# Patient Record
Sex: Male | Born: 1990 | Race: White | Hispanic: No | Marital: Married
Health system: Southern US, Community
[De-identification: ages and names within clinical notes are randomized; demographics above are authoritative.]

## PROBLEM LIST (undated history)

## (undated) HISTORY — PX: LEG SURGERY: SHX1003

---

## 2012-09-26 ENCOUNTER — Ambulatory Visit (HOSPITAL_BASED_OUTPATIENT_CLINIC_OR_DEPARTMENT_OTHER)
Admission: RE | Admit: 2012-09-26 | Discharge: 2012-09-26 | Disposition: A | Discharge status: Home / Self Care | Payer: 59 | Source: Ambulatory Visit | Attending: Sports Medicine | Admitting: Sports Medicine

## 2012-09-26 ENCOUNTER — Ambulatory Visit (INDEPENDENT_AMBULATORY_CARE_PROVIDER_SITE_OTHER): Payer: 59 | Admitting: Sports Medicine

## 2012-09-26 VITALS — BP 137/81 | HR 74 | Ht 74.0 in | Wt 303.0 lb

## 2012-09-26 DIAGNOSIS — IMO0002 Reserved for concepts with insufficient information to code with codable children: Secondary | ICD-10-CM | POA: Insufficient documentation

## 2012-09-26 DIAGNOSIS — M545 Low back pain, unspecified: Secondary | ICD-10-CM | POA: Insufficient documentation

## 2012-09-26 DIAGNOSIS — Z299 Encounter for prophylactic measures, unspecified: Secondary | ICD-10-CM | POA: Insufficient documentation

## 2012-09-26 DIAGNOSIS — M5416 Radiculopathy, lumbar region: Secondary | ICD-10-CM

## 2012-09-26 MED ORDER — MELOXICAM 15 MG PO TABS: ORAL_TABLET | ORAL | Status: DC

## 2012-09-26 MED ORDER — PREDNISONE 50 MG PO TABS: ORAL_TABLET | ORAL | Status: DC

## 2012-09-26 NOTE — Assessment & Plan Note (Signed)
Checking routine bloodwork. 

## 2012-09-26 NOTE — Assessment & Plan Note (Signed)
This likely were present the left L5 radiculitis. X-rays, prednisone, Mobic, formal physical therapy. Return in 4 weeks, MRI for interventional injection planning if no better.

## 2012-09-26 NOTE — Progress Notes (Signed)
  Subjective:    CC: Establish care.   HPI:  Low back pain: Has been present for many years, localized in the left side of the low back, he thinks it started when he was hit in the back with a piece of metal at work. Since then he's had pain on and off, radiating down the left leg and L5 distribution, worse with sitting, worse with Valsalva. He went to another physician who told him nothing was wrong. No imaging was performed. Currently symptoms are moderate, persistent, denies any bowel or bladder incontinence, no constitutional symptoms.  Preventive measures: Like to establish care.  Past medical history, Surgical history, Family history not pertinant except as noted below, Social history, Allergies, and medications have been entered into the medical record, reviewed, and no changes needed.   Review of Systems: No headache, visual changes, nausea, vomiting, diarrhea, constipation, dizziness, abdominal pain, skin rash, fevers, chills, night sweats, swollen lymph nodes, weight loss, chest pain, body aches, joint swelling, muscle aches, shortness of breath, mood changes, visual or auditory hallucinations.  Objective:    General: Well Developed, well nourished, and in no acute distress.  Neuro: Alert and oriented x3, extra-ocular muscles intact, sensation grossly intact.  HEENT: Normocephalic, atraumatic, pupils equal round reactive to light, neck supple, no masses, no lymphadenopathy, thyroid nonpalpable.  Skin: Warm and dry, no rashes noted.  Cardiac: Regular rate and rhythm, no murmurs rubs or gallops.  Respiratory: Clear to auscultation bilaterally. Not using accessory muscles, speaking in full sentences.  Abdominal: Soft, nontender, nondistended, positive bowel sounds, no masses, no organomegaly.  Back Exam:  Inspection: Unremarkable  Motion: Flexion 45 deg, Extension 45 deg, Side Bending to 45 deg bilaterally,  Rotation to 45 deg bilaterally  SLR laying: Negative  XSLR laying: Negative   Palpable tenderness: None. FABER: negative. Sensory change: Gross sensation intact to all lumbar and sacral dermatomes.  Reflexes: 2+ at both patellar tendons, 2+ at achilles tendons, Babinski's downgoing.  Strength at foot  Plantar-flexion: 5/5 Dorsi-flexion: 5/5 Eversion: 5/5 Inversion: 5/5  Leg strength  Quad: 5/5 Hamstring: 5/5 Hip flexor: 5/5 Hip abductors: 5/5  Gait unremarkable.  Impression and Recommendations:    The patient was counselled, risk factors were discussed, anticipatory guidance given.

## 2012-10-07 LAB — COMPREHENSIVE METABOLIC PANEL WITH GFR
ALT: 41 U/L (ref 0–53)
AST: 24 U/L (ref 0–37)
Albumin: 3.9 g/dL (ref 3.5–5.2)
Alkaline Phosphatase: 73 U/L (ref 39–117)
Glucose, Bld: 100 mg/dL — ABNORMAL HIGH (ref 70–99)
Potassium: 4.3 meq/L (ref 3.5–5.3)
Sodium: 138 meq/L (ref 135–145)
Total Bilirubin: 0.7 mg/dL (ref 0.3–1.2)
Total Protein: 6.9 g/dL (ref 6.0–8.3)

## 2012-10-07 LAB — COMPREHENSIVE METABOLIC PANEL
BUN: 10 mg/dL (ref 6–23)
CO2: 23 mEq/L (ref 19–32)
Calcium: 9 mg/dL (ref 8.4–10.5)
Chloride: 104 mEq/L (ref 96–112)
Creat: 0.75 mg/dL (ref 0.50–1.35)

## 2012-10-07 LAB — HEMOGLOBIN A1C
Hgb A1c MFr Bld: 5 % (ref <5.7)
Mean Plasma Glucose: 97 mg/dL (ref <117)

## 2012-10-07 LAB — CBC
HCT: 41.7 % (ref 39.0–52.0)
Hemoglobin: 14.2 g/dL (ref 13.0–17.0)
MCH: 29.6 pg (ref 26.0–34.0)
MCHC: 34.1 g/dL (ref 30.0–36.0)
MCV: 87.1 fL (ref 78.0–100.0)
Platelets: 234 K/uL (ref 150–400)
RBC: 4.79 MIL/uL (ref 4.22–5.81)
RDW: 14.4 % (ref 11.5–15.5)
WBC: 6.1 10*3/uL (ref 4.0–10.5)

## 2012-10-07 LAB — LIPID PANEL
Cholesterol: 183 mg/dL (ref 0–200)
HDL: 43 mg/dL (ref >39)
LDL Cholesterol: 96 mg/dL (ref 0–99)
Total CHOL/HDL Ratio: 4.3 ratio
Triglycerides: 221 mg/dL — ABNORMAL HIGH (ref <150)
VLDL: 44 mg/dL — ABNORMAL HIGH (ref 0–40)

## 2012-10-07 LAB — TSH: TSH: 1.937 u[IU]/mL (ref 0.350–4.500)

## 2012-10-08 LAB — VITAMIN D 25 HYDROXY (VIT D DEFICIENCY, FRACTURES): Vit D, 25-Hydroxy: 31 ng/mL (ref 30–89)

## 2012-10-10 LAB — TESTOSTERONE, FREE, TOTAL, SHBG
Sex Hormone Binding: 11 nmol/L — ABNORMAL LOW (ref 13–71)
Testosterone, Free: 60.2 pg/mL (ref 47.0–244.0)
Testosterone-% Free: 3.1 % — ABNORMAL HIGH (ref 1.6–2.9)
Testosterone: 196 ng/dL — ABNORMAL LOW (ref 300–890)

## 2012-10-24 ENCOUNTER — Encounter: Payer: Self-pay | Admitting: Sports Medicine

## 2012-10-24 ENCOUNTER — Ambulatory Visit (INDEPENDENT_AMBULATORY_CARE_PROVIDER_SITE_OTHER): Payer: 59 | Admitting: Sports Medicine

## 2012-10-24 VITALS — BP 134/83 | HR 81 | Wt 307.0 lb

## 2012-10-24 DIAGNOSIS — E781 Pure hyperglyceridemia: Secondary | ICD-10-CM

## 2012-10-24 DIAGNOSIS — M5416 Radiculopathy, lumbar region: Secondary | ICD-10-CM

## 2012-10-24 DIAGNOSIS — IMO0002 Reserved for concepts with insufficient information to code with codable children: Secondary | ICD-10-CM

## 2012-10-24 MED ORDER — GABAPENTIN 300 MG PO CAPS: 300.0000 mg | ORAL_CAPSULE | Freq: Three times a day (TID) | ORAL | Status: DC

## 2012-10-24 NOTE — Progress Notes (Signed)
  Subjective:    CC: Follow up  HPI: Left lumbar radiculitis: Greatly improved with conservative measures, not doing any rehabilitation exercises yet the symptoms are predominantly at night. Symptoms are mild, continue to improve, radiate down the left leg in an L4 versus L5 distribution.  Hypertriglyceridemia:  Desires dietary strategies to improve this.  Past medical history, Surgical history, Family history not pertinant except as noted below, Social history, Allergies, and medications have been entered into the medical record, reviewed, and no changes needed.   Review of Systems: No fevers, chills, night sweats, weight loss, chest pain, or shortness of breath.   Objective:    General: Well Developed, well nourished, and in no acute distress.  Neuro: Alert and oriented x3, extra-ocular muscles intact, sensation grossly intact.  HEENT: Normocephalic, atraumatic, pupils equal round reactive to light, neck supple, no masses, no lymphadenopathy, thyroid nonpalpable.  Skin: Warm and dry, no rashes. Cardiac: Regular rate and rhythm, no murmurs rubs or gallops, no lower extremity edema.  Respiratory: Clear to auscultation bilaterally. Not using accessory muscles, speaking in full sentences.  X-rays reviewed personally, there is some anterior spurring at the L4-L5 level.  Impression and Recommendations:

## 2012-10-24 NOTE — Patient Instructions (Addendum)

## 2012-10-24 NOTE — Assessment & Plan Note (Signed)
Recommend dietary changes, he does think he may have eaten a high fat meal for fasting for his cholesterol test. We can recheck this in about 3 months.

## 2012-10-24 NOTE — Assessment & Plan Note (Signed)
Definitely improved. He does have some symptoms at night, I will add gabapentin at bedtime. He will do home exercises. I like to see him back in one month, MRI for interventional injection planning if no better. He does have some L4-L5 degenerative disc disease with anterior spurring seen on x-ray.

## 2012-11-22 ENCOUNTER — Encounter: Payer: Self-pay | Admitting: Sports Medicine

## 2012-11-22 ENCOUNTER — Ambulatory Visit (INDEPENDENT_AMBULATORY_CARE_PROVIDER_SITE_OTHER): Payer: 59 | Admitting: Sports Medicine

## 2012-11-22 VITALS — BP 136/85 | HR 104 | Wt 311.0 lb

## 2012-11-22 DIAGNOSIS — IMO0002 Reserved for concepts with insufficient information to code with codable children: Secondary | ICD-10-CM

## 2012-11-22 DIAGNOSIS — M5416 Radiculopathy, lumbar region: Secondary | ICD-10-CM

## 2012-11-22 MED ORDER — GABAPENTIN 300 MG PO CAPS: 300.0000 mg | ORAL_CAPSULE | Freq: Three times a day (TID) | ORAL | Status: DC

## 2012-11-22 MED ORDER — MELOXICAM 15 MG PO TABS: ORAL_TABLET | ORAL | Status: DC

## 2012-11-22 NOTE — Assessment & Plan Note (Signed)
Doing well with conservative measures including therapy, gabapentin, meloxicam. Switched to a Tempur-Pedic mattress which is evidence based to improve discogenic low back pain. Return to see me on an as-needed basis, if pain continues despite a gabapentin taper we will proceed with MRI for interventional injection planning.

## 2012-11-22 NOTE — Progress Notes (Signed)
  Subjective:    CC: Followup  HPI: Left lumbar radiculitis: Significantly improved with physical therapy, gabapentin, Mobic, she recently got a Tempur-Pedic mattress which is also helped his symptoms significantly. He is happy with the results so far. He only gets occasional pain, it really doesn't radiate down the leg anymore. It is mild, continues to improve. Worse with excessive physical activity and lifting.  Past medical history, Surgical history, Family history not pertinant except as noted below, Social history, Allergies, and medications have been entered into the medical record, reviewed, and no changes needed.   Review of Systems: No fevers, chills, night sweats, weight loss, chest pain, or shortness of breath.   Objective:    General: Well Developed, well nourished, and in no acute distress.  Neuro: Alert and oriented x3, extra-ocular muscles intact, sensation grossly intact.  HEENT: Normocephalic, atraumatic, pupils equal round reactive to light, neck supple, no masses, no lymphadenopathy, thyroid nonpalpable.  Skin: Warm and dry, no rashes. Cardiac: Regular rate and rhythm, no murmurs rubs or gallops, no lower extremity edema.  Respiratory: Clear to auscultation bilaterally. Not using accessory muscles, speaking in full sentences. Back Exam:  Inspection: Unremarkable  Motion: Flexion 45 deg, Extension 45 deg, Side Bending to 45 deg bilaterally,  Rotation to 45 deg bilaterally  SLR laying: Negative  XSLR laying: Negative  Palpable tenderness: None. FABER: negative. Sensory change: Gross sensation intact to all lumbar and sacral dermatomes.  Reflexes: 2+ at both patellar tendons, 2+ at achilles tendons, Babinski's downgoing.  Strength at foot  Plantar-flexion: 5/5 Dorsi-flexion: 5/5 Eversion: 5/5 Inversion: 5/5  Leg strength  Quad: 5/5 Hamstring: 5/5 Hip flexor: 5/5 Hip abductors: 5/5  Gait unremarkable.  Impression and Recommendations:

## 2012-12-03 ENCOUNTER — Emergency Department
Admission: EM | Admit: 2012-12-03 | Discharge: 2012-12-03 | Disposition: A | Discharge status: Home / Self Care | Payer: 59 | Source: Home / Self Care | Attending: Family Medicine | Admitting: Family Medicine

## 2012-12-03 DIAGNOSIS — L255 Unspecified contact dermatitis due to plants, except food: Secondary | ICD-10-CM

## 2012-12-03 MED ORDER — HYDROXYZINE HCL 50 MG PO TABS: 50.0000 mg | ORAL_TABLET | Freq: Four times a day (QID) | ORAL | Status: DC

## 2012-12-03 MED ORDER — TRIAMCINOLONE ACETONIDE 40 MG/ML IJ SUSP
40.0000 mg | Freq: Once | INTRAMUSCULAR | Status: AC
  Administered 2012-12-03: 40 mg via INTRAMUSCULAR

## 2012-12-03 NOTE — ED Notes (Addendum)
Tim Farrell has a rash on his arms, face and chest for 2 days. He believes this to be poison ivy. Denies fever, chills or sweats. He has tried Benadryl with little relief.

## 2012-12-03 NOTE — ED Provider Notes (Signed)
CSN: [id number]     Arrival date & time 12/03/12  0919 History   First MD Initiated Contact with Patient 12/03/12 [phone]     Chief Complaint  Patient presents with  . Rash    x 2 days      HPI Comments: Patient states he was exposed to poison ivy two days ago, and now has pruritic rash on arms, legs, abdomen, and forehead.  No systemic symptoms, feels well otherwise.  Itching not responding to Benadryl.  Patient is a 22 y.o. male presenting with rash. The history is provided by the patient and the spouse.  Rash Pain location:  Generalized Pain quality comment:  Itching Pain severity:  Mild Onset quality:  Gradual Duration:  2 days Timing:  Constant Progression:  Worsening Chronicity:  New Context comment:  Poison ivy contact Relieved by:  Nothing Worsened by:  Nothing tried Ineffective treatments: Benadryl. Associated symptoms: no chills, no cough, no fatigue, no fever, no sore throat and no vomiting     History reviewed. No pertinent past medical history. History reviewed. No pertinent past surgical history. History reviewed. No pertinent family history. History  Substance Use Topics  . Smoking status: Unknown If Ever Smoked  . Smokeless tobacco: Not on file  . Alcohol Use: Yes    Review of Systems  Constitutional: Negative for fever, chills and fatigue.  HENT: Negative for sore throat.   Respiratory: Negative for cough.   Gastrointestinal: Negative for vomiting.  Skin: Positive for rash.  All other systems reviewed and are negative.    Allergies  Review of patient's allergies indicates no known allergies.  Home Medications   Current Outpatient Rx  Name  Route  Sig  Dispense  Refill  . gabapentin (NEURONTIN) 300 MG capsule   Oral   Take 1 capsule (300 mg total) by mouth 3 (three) times daily.   90 capsule   3   . meloxicam (MOBIC) 15 MG tablet      One tab PO qAM with breakfast for 2 weeks, then daily prn pain.   90 tablet   3   . hydrOXYzine  (ATARAX/VISTARIL) 50 MG tablet   Oral   Take 1 tablet (50 mg total) by mouth every 6 (six) hours. For itching and rash   12 tablet   1    BP 131/83  Pulse 71  Temp(Src) 98.2 F (36.8 C) (Oral)  Ht 6\' 2"  (1.88 m)  Wt 306 lb (138.801 kg)  BMI 39.27 kg/m2  SpO2 98% Physical Exam Nursing notes and Vital Signs reviewed. Appearance:  Patient appears stated age, and in no acute distress.  Patient is obese (BMI 39.3) Eyes:  Pupils are equal, round, and reactive to light and accomodation.  Extraocular movement is intact.  Conjunctivae are not inflamed  Extremities:  No edema.   Skin:  Scattered erythematous linear lesions, some with small vesicles on arms, legs, lower abdomen.  Mild swelling forehead.  No evidence cellulitis  ED Course  Procedures  none        MDM   1. Rhus dermatitis    Kenalog 40mg  IM.  Hydroxyzine 50mg  Q6hr for itching Call if not improving about 3 days (would add oral prednisone)    Lattie Haw, MD 12/03/12 [phone]

## 2012-12-04 ENCOUNTER — Telehealth: Payer: Self-pay | Admitting: Emergency Medicine

## 2012-12-05 ENCOUNTER — Encounter: Payer: Self-pay | Admitting: Sports Medicine

## 2012-12-05 ENCOUNTER — Ambulatory Visit (INDEPENDENT_AMBULATORY_CARE_PROVIDER_SITE_OTHER): Payer: 59 | Admitting: Sports Medicine

## 2012-12-05 VITALS — BP 143/91 | HR 122 | Wt 303.0 lb

## 2012-12-05 DIAGNOSIS — L237 Allergic contact dermatitis due to plants, except food: Secondary | ICD-10-CM | POA: Insufficient documentation

## 2012-12-05 DIAGNOSIS — L255 Unspecified contact dermatitis due to plants, except food: Secondary | ICD-10-CM

## 2012-12-05 MED ORDER — METHYLPREDNISOLONE ACETATE 40 MG/ML IJ SUSP
40.0000 mg | Freq: Once | INTRAMUSCULAR | Status: AC
  Administered 2012-12-05: 40 mg via INTRAMUSCULAR

## 2012-12-05 MED ORDER — METHYLPREDNISOLONE SODIUM SUCC 125 MG IJ SOLR
250.0000 mg | Freq: Once | INTRAMUSCULAR | Status: AC
  Administered 2012-12-05: 250 mg via INTRAMUSCULAR

## 2012-12-05 MED ORDER — PREDNISONE (PAK) 10 MG PO TABS: ORAL_TABLET | ORAL | Status: DC

## 2012-12-05 NOTE — Progress Notes (Signed)
  Subjective:    CC: Skin rash  HPI: For approximately 3 days now just and has had a severe rash after working outside and getting into contact with poison ivy. He went to the emergency department and was given a shot of Kenalog 40, as well as 20 mg of oral prednisone daily. Unfortunately symptoms are persistent, in fact worsening. Moderate, localized over the entire body, no shortness of breath, no constitutional symptoms.  Past medical history, Surgical history, Family history not pertinant except as noted below, Social history, Allergies, and medications have been entered into the medical record, reviewed, and no changes needed.   Review of Systems: No fevers, chills, night sweats, weight loss, chest pain, or shortness of breath.   Objective:    General: Well Developed, well nourished, and in no acute distress.  Neuro: Alert and oriented x3, extra-ocular muscles intact, sensation grossly intact.  HEENT: Normocephalic, atraumatic, pupils equal round reactive to light, neck supple, no masses, no lymphadenopathy, thyroid nonpalpable.  Skin: Warm and dry, there is a papulovesicular rash on arms, neck, legs, abdomen, back. Cardiac: Regular rate and rhythm, no murmurs rubs or gallops, no lower extremity edema.  Respiratory: Clear to auscultation bilaterally. Not using accessory muscles, speaking in full sentences. Impression and Recommendations:

## 2012-12-05 NOTE — Assessment & Plan Note (Signed)
Not responding to prednisone 20 mg daily prescribed urgent care. Solu-Medrol 250 intramuscular, Depo-Medrol 40 and from ocular, prednisone taper, 12 days. Benadryl 50 mg at bedtime. Return in one and a half weeks.

## 2012-12-15 ENCOUNTER — Encounter: Payer: Self-pay | Admitting: Sports Medicine

## 2012-12-15 ENCOUNTER — Ambulatory Visit (INDEPENDENT_AMBULATORY_CARE_PROVIDER_SITE_OTHER): Payer: 59 | Admitting: Sports Medicine

## 2012-12-15 VITALS — BP 140/81 | HR 79 | Wt 314.0 lb

## 2012-12-15 DIAGNOSIS — L237 Allergic contact dermatitis due to plants, except food: Secondary | ICD-10-CM

## 2012-12-15 DIAGNOSIS — L255 Unspecified contact dermatitis due to plants, except food: Secondary | ICD-10-CM

## 2012-12-15 NOTE — Assessment & Plan Note (Addendum)
Resolved, return as needed 

## 2012-12-15 NOTE — Progress Notes (Signed)
  Subjective:    CC: Followup  HPI: Poison ivy dermatitis: Resolved with Depo-Medrol 40, Solu-Medrol 250, prednisone taper, Benadryl..  Past medical history, Surgical history, Family history not pertinant except as noted below, Social history, Allergies, and medications have been entered into the medical record, reviewed, and no changes needed.   Review of Systems: No fevers, chills, night sweats, weight loss, chest pain, or shortness of breath.   Objective:    General: Well Developed, well nourished, and in no acute distress.  Neuro: Alert and oriented x3, extra-ocular muscles intact, sensation grossly intact.  HEENT: Normocephalic, atraumatic, pupils equal round reactive to light, neck supple, no masses, no lymphadenopathy, thyroid nonpalpable.  Skin: Warm and dry, no rashes. Still has some flaking of skin, but rash is gone. Cardiac: Regular rate and rhythm, no murmurs rubs or gallops, no lower extremity edema.  Respiratory: Clear to auscultation bilaterally. Not using accessory muscles, speaking in full sentences.  Impression and Recommendations:

## 2013-01-13 ENCOUNTER — Emergency Department
Admission: EM | Admit: 2013-01-13 | Discharge: 2013-01-13 | Disposition: A | Discharge status: Home / Self Care | Payer: 59 | Source: Home / Self Care | Attending: Family Medicine | Admitting: Family Medicine

## 2013-01-13 ENCOUNTER — Encounter: Payer: Self-pay | Admitting: Emergency Medicine

## 2013-01-13 DIAGNOSIS — R509 Fever, unspecified: Secondary | ICD-10-CM

## 2013-01-13 DIAGNOSIS — R319 Hematuria, unspecified: Secondary | ICD-10-CM

## 2013-01-13 LAB — POCT CBC W AUTO DIFF (K'VILLE URGENT CARE)

## 2013-01-13 MED ORDER — CIPROFLOXACIN HCL 500 MG PO TABS: 500.0000 mg | ORAL_TABLET | Freq: Two times a day (BID) | ORAL | Status: DC

## 2013-01-13 NOTE — ED Notes (Signed)
Tim Farrell complains of fever, chills, sweats, body aches, dizziness and nausea for 1 day. He has not had a flu vaccine this season.

## 2013-01-13 NOTE — ED Provider Notes (Signed)
CSN: [id number]     Arrival date & time 01/13/13  1844 History   First MD Initiated Contact with Patient 01/13/13 2039     Chief Complaint  Patient presents with  . Sore Throat    x 1 day  . Fever    x 1 day     HPI Comments: Patient complains of onset yesterday evening of flu-like symptoms with sore throat, headache, fatigue, dizziness, body aches, and chills/fever.  The sore throat has persisted today.  He has had nausea without vomiting.  No cough.  No urinary symptoms. He has a history of lumbar radiculitis, treated by Dr. Rodney Langton.  Recently his back pain has improved and he is taking Mobic on a prn basis.  He reports that his backache is slightly worse today but denies flank pain.  The history is provided by the patient and the spouse.    History reviewed. No pertinent past medical history. History reviewed. No pertinent past surgical history. History reviewed. No pertinent family history. History  Substance Use Topics  . Smoking status: Unknown If Ever Smoked  . Smokeless tobacco: Not on file  . Alcohol Use: Yes    Review of Systems + sore throat No cough No pleuritic pain No wheezing No nasal congestion No post-nasal drainage No sinus pain/pressure No itchy/red eyes No earache No hemoptysis No SOB + fever, + chills + nausea No vomiting No abdominal pain No diarrhea No urinary symptoms No skin rashes + fatigue + myalgias + headache Used OTC meds without relief   Allergies  Review of patient's allergies indicates no known allergies.  Home Medications   Current Outpatient Rx  Name  Route  Sig  Dispense  Refill  . gabapentin (NEURONTIN) 300 MG capsule   Oral   Take 1 capsule (300 mg total) by mouth 3 (three) times daily.   90 capsule   3   . meloxicam (MOBIC) 15 MG tablet      One tab PO qAM with breakfast for 2 weeks, then daily prn pain.   90 tablet   3    BP 122/78  Pulse 153  Temp(Src) 103 F (39.4 C) (Oral)  Ht 6\' 2"  (1.88  m)  Wt 307 lb (139.254 kg)  BMI 39.4 kg/m2  SpO2 97% Physical Exam Nursing notes and Vital Signs reviewed. Appearance:  Patient appears stated age, and in no acute distress.  Patient is obese (BMI 39.4).  He is alert and oriented.  Eyes:  Pupils are equal, round, and reactive to light and accomodation.  Extraocular movement is intact.  Conjunctivae are not inflamed  Ears:  Canals normal.  Tympanic membranes normal.  Nose:  Mildly congested turbinates.  No sinus tenderness.    Pharynx:  Slightly edematous with mild erythema.  No exudate.  No obstruction Neck:  Supple.  Non tender shotty posterior nodes are palpated bilaterally  Lungs:  Clear to auscultation.  Breath sounds are equal.  Heart:  Regular rate and rhythm without murmurs, rubs, or gallops.  Rate 100 Abdomen:  Nontender without masses or hepatosplenomegaly.  Bowel sounds are present.  No CVA or flank tenderness.  Extremities:  No edema.  No calf tenderness Skin:  No rash present.  Back:  No tenderness palpated ED Course  Procedures  none    Labs Reviewed  POCT CBC W AUTO DIFF ([facility]) - Abnormal; Notable for the following:   WBC 15.6; LY 4.1; MO 2.4; GR 93.5; Hgb 15.0; Platelets 218  POCT RAPID STREP A (OFFICE) - Normal  POCT INFLUENZA A/B - Normal POCT Urinalysis (dipstick):  BIL small; KET Trace; SG >= 1.030; BLO Large; PRO >= 300mg /dL         MDM   1. Fever; ?UTI (note large blood on U/A); ?early URI, ?mono    Throat culture, urine culture, CMP pending Begin Cipro to cover possible UTI. Increase fluid intake.  May take Tylenol for fever, headache, etc.  Stop Mobic for now. If symptoms become significantly worse during the night or over the weekend, proceed to the local emergency room.  Return tomorrow for follow-up.    Lattie Haw, MD 01/14/13 [id number]

## 2013-01-14 ENCOUNTER — Emergency Department (INDEPENDENT_AMBULATORY_CARE_PROVIDER_SITE_OTHER)
Admission: EM | Admit: 2013-01-14 | Discharge: 2013-01-14 | Discharge status: Absent without leave | Payer: 59 | Source: Home / Self Care

## 2013-01-14 DIAGNOSIS — R509 Fever, unspecified: Secondary | ICD-10-CM

## 2013-01-14 LAB — COMPLETE METABOLIC PANEL WITH GFR
ALT: 33 U/L (ref 0–53)
Alkaline Phosphatase: 61 U/L (ref 39–117)
Creat: 1.04 mg/dL (ref 0.50–1.35)
GFR, Est Non African American: >89 mL/min
Glucose, Bld: 120 mg/dL — ABNORMAL HIGH (ref 70–99)
Sodium: 137 mEq/L (ref 135–145)
Total Bilirubin: 1.2 mg/dL (ref 0.3–1.2)
Total Protein: 7.4 g/dL (ref 6.0–8.3)

## 2013-01-15 ENCOUNTER — Telehealth: Payer: Self-pay | Admitting: Emergency Medicine

## 2013-01-15 LAB — POCT URINALYSIS DIP (MANUAL ENTRY)
Glucose, UA: NEGATIVE
Leukocytes, UA: NEGATIVE
Nitrite, UA: NEGATIVE
Spec Grav, UA: >=1.03 (ref 1.005–1.03)
Urobilinogen, UA: 0.2 (ref 0–1)

## 2013-01-15 LAB — URINE CULTURE: Organism ID, Bacteria: NO GROWTH

## 2013-01-20 ENCOUNTER — Encounter: Payer: Self-pay | Admitting: Sports Medicine

## 2013-01-20 ENCOUNTER — Ambulatory Visit (INDEPENDENT_AMBULATORY_CARE_PROVIDER_SITE_OTHER): Payer: 59 | Admitting: Sports Medicine

## 2013-01-20 VITALS — BP 152/79 | HR 57 | Wt 309.0 lb

## 2013-01-20 DIAGNOSIS — R319 Hematuria, unspecified: Secondary | ICD-10-CM | POA: Insufficient documentation

## 2013-01-20 LAB — POCT URINALYSIS DIPSTICK
Bilirubin, UA: NEGATIVE
Glucose, UA: NEGATIVE
Ketones, UA: NEGATIVE
Leukocytes, UA: NEGATIVE
Nitrite, UA: NEGATIVE
Protein, UA: NEGATIVE
Spec Grav, UA: >=1.03
Urobilinogen, UA: 1
pH, UA: 7

## 2013-01-20 NOTE — Assessment & Plan Note (Signed)
Starting workup, hematuria is persistent. He did finish the course of antibiotics. Checking a renal workup. Renal ultrasound. Return to see me in about a week to go over all results. If inconclusive we can certainly consider nephrology referral for consideration of biopsy.

## 2013-01-20 NOTE — Progress Notes (Signed)
  Subjective:    CC: Hematuria  HPI: This is a pleasant, otherwise healthy 22 year old here for evaluation of hematuria. He was seen in urgent care one week ago for fever, chills, sore throat and dizziness. He was also having some back pain. He tested negative for strep and influenza but blood was found on a urine dipstick. He denies any nausea, vomiting or change in appetite. He denies any urinary frequency, urgency, burning. He reports that his urine sometimes has an Electrical engineer and an unusual odor. He does not drink much water. His work involves heavy lifting and he admits to a little fatigue recently. He is a nonsmoker and has no history of kidney stones. He was started on a 7-day course of cipro but stopped taking it because he all of his symptoms had resolved after 5 days. He feels well today.  Past medical history, Surgical history, Family history not pertinant except as noted below, Social history, Allergies, and medications have been entered into the medical record, reviewed, and no changes needed.   Review of Systems: No fevers, chills, night sweats, weight loss, chest pain, or shortness of breath.   Objective:    General: Well Developed, well nourished, and in no acute distress.  Neuro: Alert and oriented x3, extra-ocular muscles intact, sensation grossly intact.  HEENT: Normocephalic, atraumatic, pupils equal round reactive to light, neck supple, no masses, no lymphadenopathy, thyroid nonpalpable.  Skin: Warm and dry, no rashes. Cardiac: Regular rate and rhythm, no murmurs rubs or gallops, no lower extremity edema.  Respiratory: Clear to auscultation bilaterally. Not using accessory muscles, speaking in full sentences. Abdomen: Soft, nontender, nondistended, no palpable masses, no costovertebral angle pain, no suprapubic pain.  Urinalysis showed blood and protein.  Impression and Recommendations:   Assessment: This is a 23 year old who needs further workup for persistent hematuria.    Plan: 1. Finish course of antibiotics 2. Kidney function tests 3. Renal ultrasound 4. Hydrate regularly 5. Return for follow-up in one week to go over results  This note was originally written by Karin Lieu MS3.

## 2013-01-21 LAB — COMPREHENSIVE METABOLIC PANEL WITH GFR
Albumin: 4 g/dL (ref 3.5–5.2)
BUN: 13 mg/dL (ref 6–23)
CO2: 21 meq/L (ref 19–32)
Calcium: 9 mg/dL (ref 8.4–10.5)
Chloride: 106 meq/L (ref 96–112)
Glucose, Bld: 89 mg/dL (ref 70–99)
Potassium: 3.8 meq/L (ref 3.5–5.3)

## 2013-01-21 LAB — ANTISTREPTOLYSIN O TITER: ASO: 80 [IU]/mL (ref <409)

## 2013-01-21 LAB — COMPREHENSIVE METABOLIC PANEL
ALT: 40 U/L (ref 0–53)
AST: 23 U/L (ref 0–37)
Alkaline Phosphatase: 63 U/L (ref 39–117)
Creat: 0.74 mg/dL (ref 0.50–1.35)
Sodium: 137 mEq/L (ref 135–145)
Total Bilirubin: 0.6 mg/dL (ref 0.3–1.2)
Total Protein: 6.9 g/dL (ref 6.0–8.3)

## 2013-01-21 LAB — CK: Total CK: 133 U/L (ref 7–232)

## 2013-01-23 LAB — MPO/PR-3 (ANCA) ANTIBODIES
Myeloperoxidase Abs: 2 AU/mL (ref <20)
Serine Protease 3: 18 [AU]/ml (ref <20)

## 2013-01-23 LAB — ANCA SCREEN W REFLEX TITER
Atypical p-ANCA Screen: NEGATIVE
c-ANCA Screen: NEGATIVE
p-ANCA Screen: NEGATIVE

## 2013-01-23 LAB — COMPLEMENT, TOTAL: Compl, Total (CH50): >60 U/mL — ABNORMAL HIGH (ref 31–60)

## 2013-01-23 LAB — ANA: Anti Nuclear Antibody(ANA): NEGATIVE

## 2013-01-24 LAB — PROTEIN ELECTROPHORESIS, SERUM
Albumin ELP: 54.1 % — ABNORMAL LOW (ref 55.8–66.1)
Alpha-1-Globulin: 4 % (ref 2.9–4.9)
Alpha-2-Globulin: 13.1 % — ABNORMAL HIGH (ref 7.1–11.8)
Beta 2: 7.9 % — ABNORMAL HIGH (ref 3.2–6.5)
Beta Globulin: 6.5 % (ref 4.7–7.2)
Gamma Globulin: 14.4 % (ref 11.1–18.8)
Total Protein, Serum Electrophoresis: 6.9 g/dL (ref 6.0–8.3)

## 2013-01-25 LAB — PROTEIN ELECTROPHORESIS, URINE REFLEX: Total Protein, Urine: <3 mg/dL

## 2013-01-26 ENCOUNTER — Other Ambulatory Visit: Payer: Self-pay

## 2013-01-27 ENCOUNTER — Ambulatory Visit (INDEPENDENT_AMBULATORY_CARE_PROVIDER_SITE_OTHER): Payer: 59

## 2013-01-27 ENCOUNTER — Other Ambulatory Visit (HOSPITAL_BASED_OUTPATIENT_CLINIC_OR_DEPARTMENT_OTHER): Payer: 59

## 2013-01-27 DIAGNOSIS — N289 Disorder of kidney and ureter, unspecified: Secondary | ICD-10-CM

## 2013-01-27 DIAGNOSIS — R319 Hematuria, unspecified: Secondary | ICD-10-CM

## 2013-01-27 DIAGNOSIS — N3289 Other specified disorders of bladder: Secondary | ICD-10-CM

## 2013-02-02 ENCOUNTER — Encounter: Payer: Self-pay | Admitting: Sports Medicine

## 2013-03-11 ENCOUNTER — Encounter: Payer: Self-pay | Admitting: Emergency Medicine

## 2013-03-11 ENCOUNTER — Emergency Department
Admission: EM | Admit: 2013-03-11 | Discharge: 2013-03-11 | Disposition: A | Discharge status: Home / Self Care | Payer: 59 | Source: Home / Self Care | Attending: Family Medicine | Admitting: Family Medicine

## 2013-03-11 DIAGNOSIS — L0390 Cellulitis, unspecified: Secondary | ICD-10-CM

## 2013-03-11 DIAGNOSIS — IMO0002 Reserved for concepts with insufficient information to code with codable children: Secondary | ICD-10-CM

## 2013-03-11 DIAGNOSIS — L255 Unspecified contact dermatitis due to plants, except food: Secondary | ICD-10-CM

## 2013-03-11 MED ORDER — PREDNISONE 20 MG PO TABS: ORAL_TABLET | ORAL | Status: DC

## 2013-03-11 MED ORDER — METHYLPREDNISOLONE ACETATE 40 MG/ML IJ SUSP
40.0000 mg | Freq: Once | INTRAMUSCULAR | Status: AC
  Administered 2013-03-11: 40 mg via INTRAMUSCULAR

## 2013-03-11 MED ORDER — METHYLPREDNISOLONE SODIUM SUCC 125 MG IJ SOLR
125.0000 mg | Freq: Once | INTRAMUSCULAR | Status: AC
  Administered 2013-03-11: 125 mg via INTRAMUSCULAR

## 2013-03-11 MED ORDER — HYDROXYZINE HCL 50 MG PO TABS: ORAL_TABLET | ORAL | Status: DC

## 2013-03-11 MED ORDER — DOXYCYCLINE HYCLATE 100 MG PO CAPS: 100.0000 mg | ORAL_CAPSULE | Freq: Two times a day (BID) | ORAL | Status: DC

## 2013-03-11 MED ORDER — DOMEBORO 25 % EX PACK: PACK | CUTANEOUS | Status: DC

## 2013-03-11 NOTE — ED Notes (Addendum)
Baraka complains of rash on arms and face for 1 day. Also has a rash in the groin area and redness and discharge out of his right ear. He states it looks like poison ivy.

## 2013-03-11 NOTE — ED Provider Notes (Signed)
CSN: [id number]     Arrival date & time 03/11/13  4098 History   First MD Initiated Contact with Patient 03/11/13 1102     Chief Complaint  Patient presents with  . Rash    x 1 day      HPI Comments: Patient developed a pruritic rash on his arms and behind right ear several days ago, and now has weeping behind his right ear and swelling of his right ear.  He believes this is from poison ivy because he has had it numerous times in past.  He feels well otherwise.  No fevers, chills, and sweats   Patient is a 22 y.o. male presenting with rash. The history is provided by the patient.  Rash Pain location: right ear and arms. Pain quality comment:  Itching Pain severity:  Moderate Onset quality:  Sudden Duration:  3 days Timing:  Constant Progression:  Worsening Chronicity:  Recurrent Context comment:  Working outside Relieved by:  Nothing Worsened by:  Nothing tried Ineffective treatments: Benadryl. Associated symptoms: no chills, no diarrhea, no fatigue, no fever, no nausea and no vomiting     History reviewed. No pertinent past medical history. History reviewed. No pertinent past surgical history. History reviewed. No pertinent family history. History  Substance Use Topics  . Smoking status: Unknown If Ever Smoked  . Smokeless tobacco: Not on file  . Alcohol Use: Yes    Review of Systems  Constitutional: Negative for fever, chills and fatigue.  Gastrointestinal: Negative for nausea, vomiting and diarrhea.  Skin: Positive for rash.  All other systems reviewed and are negative.    Allergies  Review of patient's allergies indicates no known allergies.  Home Medications   Current Outpatient Rx  Name  Route  Sig  Dispense  Refill  . gabapentin (NEURONTIN) 300 MG capsule   Oral   Take 1 capsule (300 mg total) by mouth 3 (three) times daily.   90 capsule   3   . meloxicam (MOBIC) 15 MG tablet      One tab PO qAM with breakfast for 2 weeks, then daily prn pain.   90  tablet   3   . Alum Sulfate-Ca Acetate (DOMEBORO) 25 % PACK      Dissolve in water and apply as a compress TID   12 each   1   . ciprofloxacin (CIPRO) 500 MG tablet   Oral   Take 1 tablet (500 mg total) by mouth 2 (two) times daily.   14 tablet   0   . doxycycline (VIBRAMYCIN) 100 MG capsule   Oral   Take 1 capsule (100 mg total) by mouth 2 (two) times daily.   20 capsule   0   . hydrOXYzine (ATARAX/VISTARIL) 50 MG tablet      Take two tabs by mouth Q6hr prn itching   30 tablet   1   . predniSONE (DELTASONE) 20 MG tablet      Take one tab by mouth twice daily for four days, then one tab daily for 3 days, then one-half daily for two days.  Take with food.   12 tablet   0    BP 150/95  Pulse 94  Temp(Src) 98.2 F (36.8 C) (Oral)  Ht 6\' 2"  (1.88 m)  Wt 306 lb (138.801 kg)  BMI 39.27 kg/m2  SpO2 98% Physical Exam  Nursing note and vitals reviewed. Constitutional: He is oriented to person, place, and time. He appears well-developed and well-nourished. No distress.  Patient is obese (BMI 39.3)  HENT:  Head: Normocephalic.  Right Ear: Tympanic membrane and ear canal normal. There is swelling and tenderness.  Nose:    Mouth/Throat: Oropharynx is clear and moist.  Right external ear is slightly swollen but nontender.  The crevice just behind the right ear is weeping clear fluid.  Just beneath the nares is a crusted yellowish discharge with mild tenderness.   Eyes: Conjunctivae are normal. Pupils are equal, round, and reactive to light.  Neck: Normal range of motion. Neck supple.  Cardiovascular: Normal heart sounds.   Pulmonary/Chest: Breath sounds normal.  Lymphadenopathy:    He has no cervical adenopathy.  Neurological: He is alert and oriented to person, place, and time.  Skin: Skin is warm and dry. Rash noted. Rash is maculopapular.     Both forearms have a maculo-papular erythematous morbilliform eruption.    ED Course  Procedures  None   Labs  Reviewed  WOUND CULTURE         MDM   1. Rhus dermatitis   2. Cellulitis/impetigo    Solumedrol 125mg  IM and DepoMedrol 40mg  IM.  Begin prednisone taper in about 3 days. Vistaril for itching. Begin empiric doxycycline.  Wound culture from behind right ear and beneath nose pending Followup with Family Doctor if not improved in about 3 days.  If symptoms become significantly worse during the night or over the weekend, proceed to the local emergency room.     Lattie Haw, MD 03/13/13 [phone]

## 2013-03-13 LAB — WOUND CULTURE
Gram Stain: NONE SEEN
Gram Stain: NONE SEEN

## 2013-05-25 ENCOUNTER — Other Ambulatory Visit: Payer: Self-pay | Admitting: Family Medicine

## 2013-05-25 ENCOUNTER — Ambulatory Visit
Admission: RE | Admit: 2013-05-25 | Discharge: 2013-05-25 | Disposition: A | Discharge status: Home / Self Care | Payer: 59 | Source: Ambulatory Visit | Attending: Family Medicine | Admitting: Family Medicine

## 2013-05-25 DIAGNOSIS — R52 Pain, unspecified: Secondary | ICD-10-CM

## 2014-08-25 IMAGING — US US RENAL
1 series · 14 of 25 positions shown · non-contrast
Comparison: None.

CLINICAL DATA: Hematuria

EXAM:
RENAL/URINARY TRACT ULTRASOUND COMPLETE

[Series 1: us renal · 0.33mm/px · 14 of 49 slices shown]
[im 1/49]
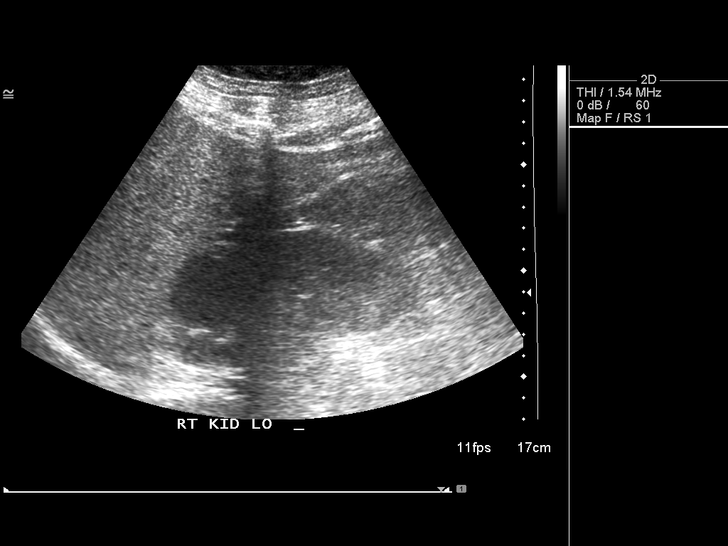
[im 5/49]
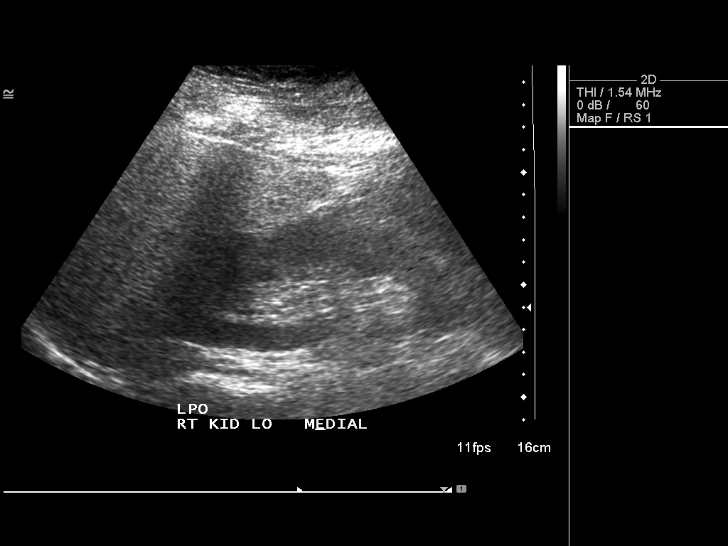
[im 9/49]
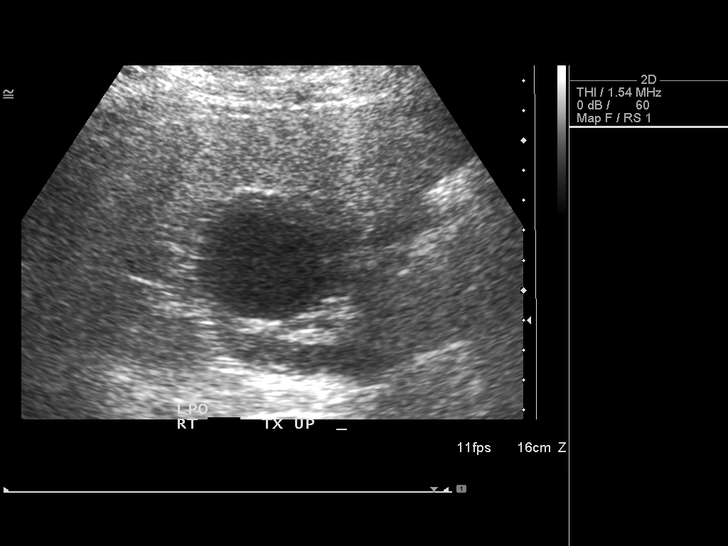
[im 13/49]
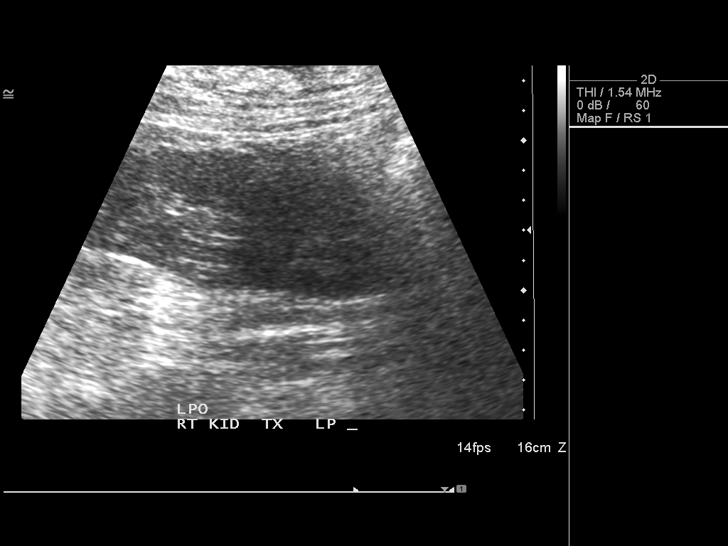
[im 17/49]
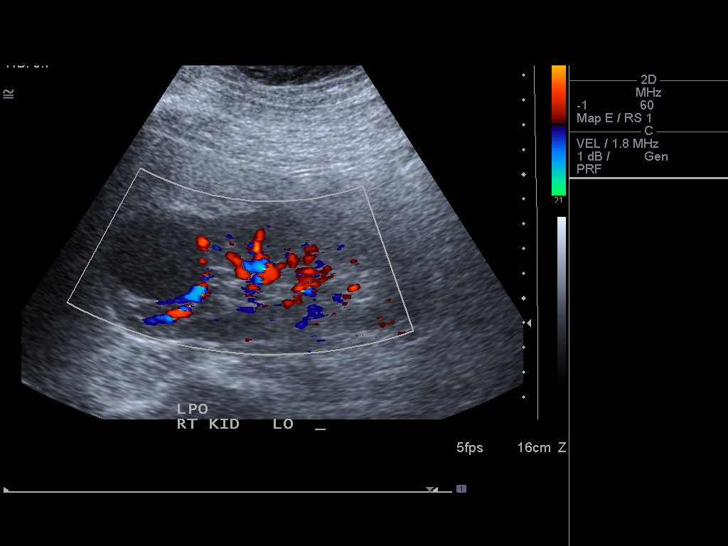
[im 19/49]
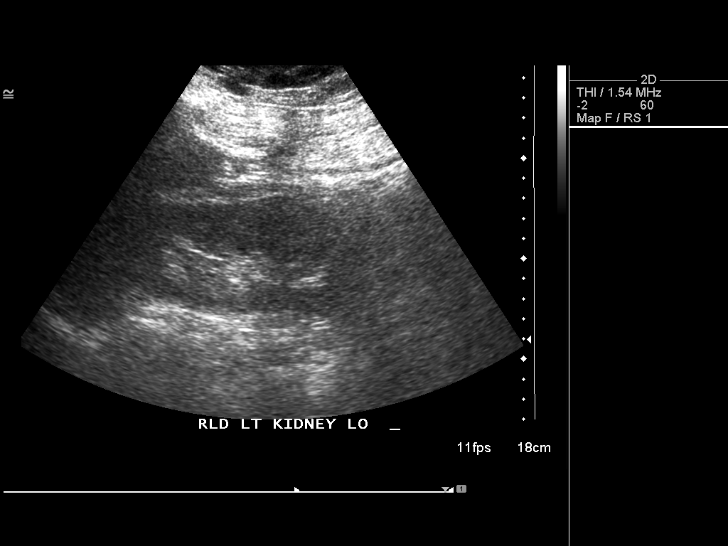
[im 23/49]
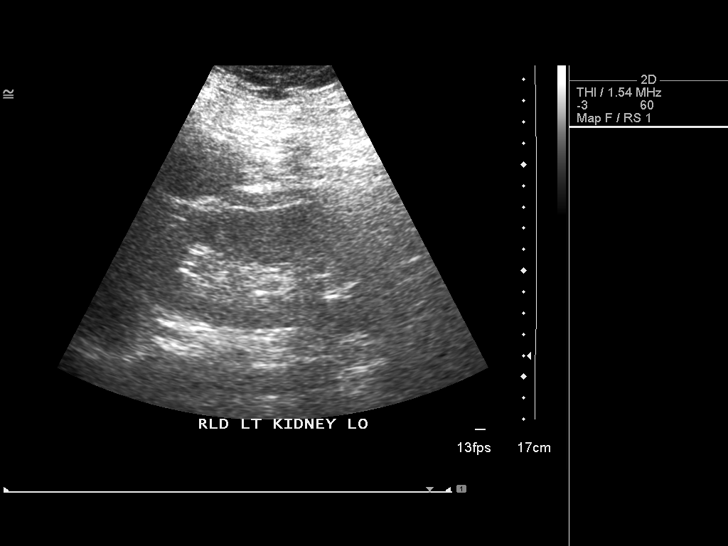
[im 27/49]
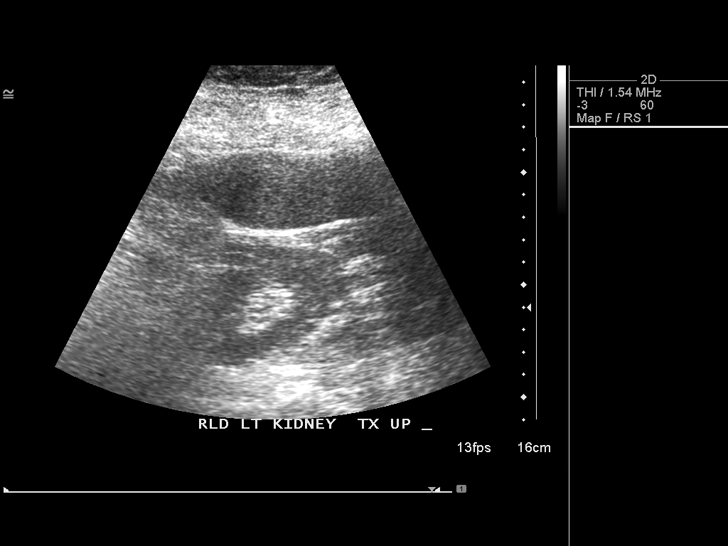
[im 31/49]
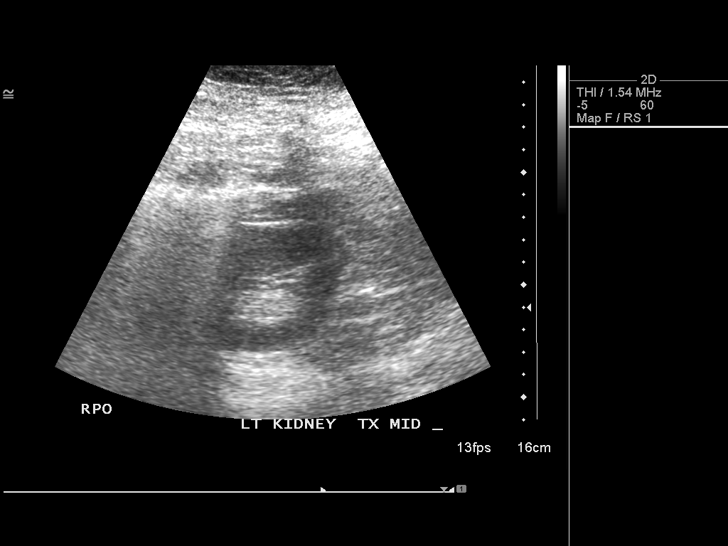
[im 33/49]
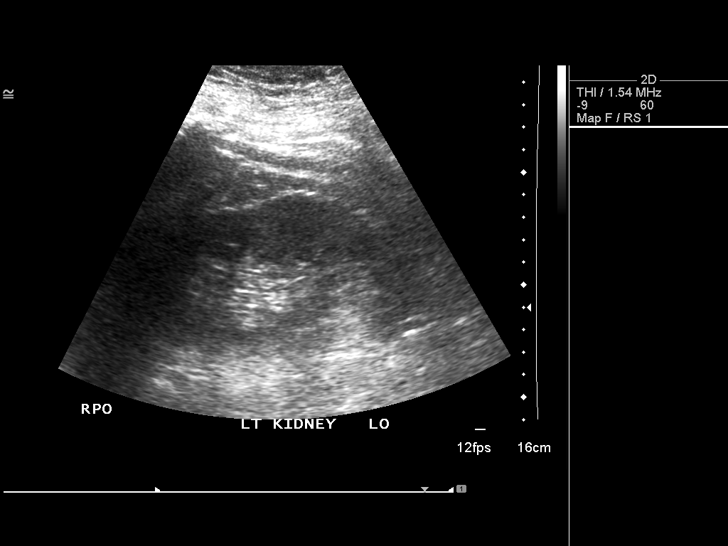
[im 37/49]
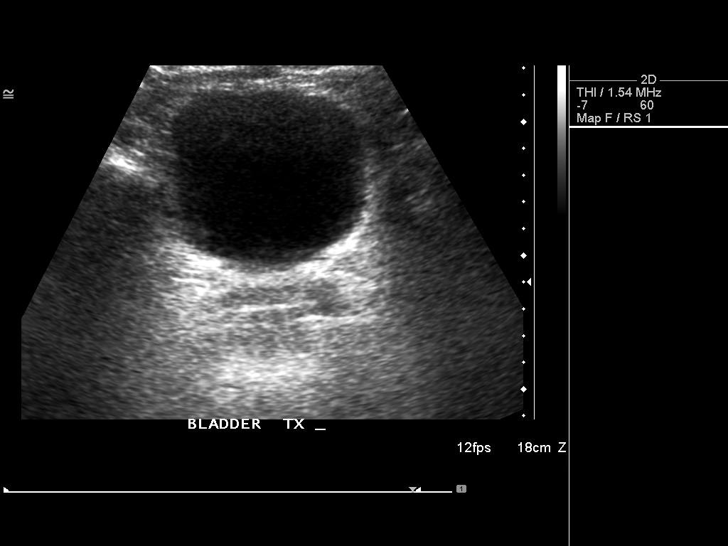
[im 41/49]
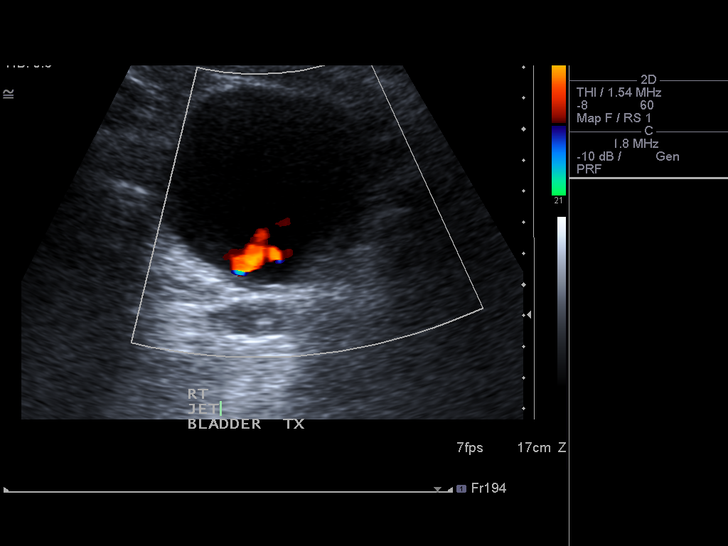
[im 45/49]
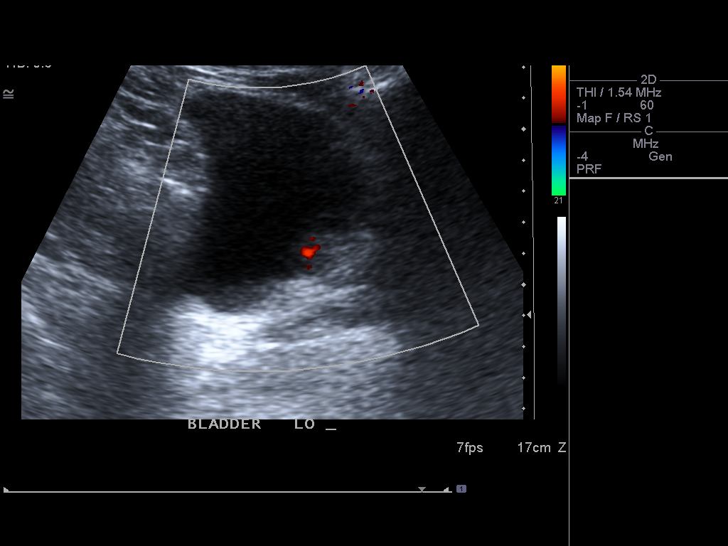
[im 49/49]
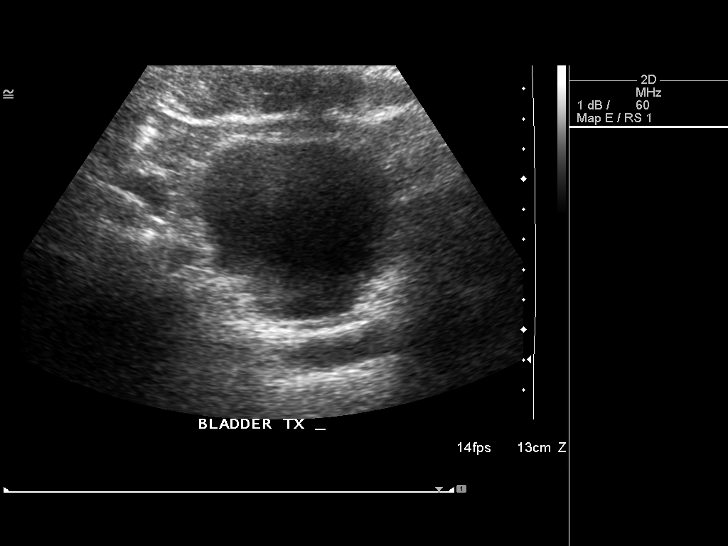

[14 of 25 positions shown; findings below may reference images not displayed]

FINDINGS: Right Kidney

Length: 11.6 cm. 3.8 cm right upper pole mass lesion. This is
probably cystic and contains a few internal echoes which may be due
to large patient size. Overall this appears to be a benign cyst with
a well-defined back wall.. Negative for hydronephrosis. Renal
cortical echogenicity is normal.

Left Kidney

Length: 12.7 cm.. Normal echogenicity. Negative for obstruction. 22
x 27 mm hypoechoic lesion mid upper pole. This could represent a
complex cyst or a solid mass.

Bladder

Solid mass lesion in the posterior inferior bladder on the right.
This could represent a bladder mass . There is blood flow within
this mass by Doppler. Neoplasm not excluded.
IMPRESSION: Solid mass in the urinary bladder. This may represent a neoplasm.
Cystoscopy and biopsy suggested.

3.8 cm cystic mass right upper pole most likely a benign cyst.

22 x 27 mm hypoechoic lesion left kidney which may represent a
complex cyst or solid mass.

## 2023-08-27 ENCOUNTER — Other Ambulatory Visit: Payer: Self-pay

## 2023-08-27 ENCOUNTER — Ambulatory Visit
Admission: EM | Admit: 2023-08-27 | Discharge: 2023-08-27 | Disposition: A | Discharge status: Home / Self Care | Attending: Family Medicine | Admitting: Family Medicine

## 2023-08-27 DIAGNOSIS — J020 Streptococcal pharyngitis: Secondary | ICD-10-CM

## 2023-08-27 LAB — POCT RAPID STREP A (OFFICE): Rapid Strep A Screen: POSITIVE — AB

## 2023-08-27 MED ORDER — PENICILLIN V POTASSIUM 500 MG PO TABS: ORAL_TABLET | ORAL | 0 refills | Status: AC

## 2023-08-27 NOTE — Discharge Instructions (Signed)
Try warm salt water gargles for sore throat.  °May take Ibuprofen 200mg, 4 tabs every 8 hours with food for sore throat. °

## 2023-08-27 NOTE — ED Triage Notes (Signed)
 X4 days has had fever and sore throat. Fever from 100.5-101. No cough. Has had nyquil, dayquil, took son's amoxicillin this morning.

## 2023-08-27 NOTE — ED Provider Notes (Signed)
 [facility]    CSN: [id number] Arrival date & time: 08/27/23  1229      History   Chief Complaint Chief Complaint  Patient presents with   Sore Throat    HPI Tim Farrell is a 33 y.o. male.   Patient complains of a persistent sore throat and fever for four days.  His fever has been as high as 101.  He denies nasal congestion and cough.  He has had Nyquil and Dayquil without inprovement.   The history is provided by the patient.    History reviewed. No pertinent past medical history.  Patient Active Problem List   Diagnosis Date Noted   Hematuria 01/20/2013   Hypertriglyceridemia 10/24/2012   Preventive measure 09/26/2012   Left lumbar radiculitis 09/26/2012    Past Surgical History:  Procedure Laterality Date   LEG SURGERY         Home Medications    Prior to Admission medications   Medication Sig Start Date End Date Taking? Authorizing Provider  penicillin  v potassium (VEETID) 500 MG tablet Take one tab by mouth Q12 hours for 10 days 08/27/23  Yes Leon Rajas, MD    Family History History reviewed. No pertinent family history.  Social History Social History   Tobacco Use   Smoking status: Never   Smokeless tobacco: Never  Substance Use Topics   Alcohol use: Not Currently   Drug use: No     Allergies   Patient has no known allergies.   Review of Systems Review of Systems + sore throat No cough No pleuritic pain No wheezing No nasal congestion No post-nasal drainage No sinus pain/pressure No itchy/red eyes No earache No hemoptysis No SOB + fever No nausea No vomiting No abdominal pain No diarrhea No urinary symptoms No skin rash No fatigue No myalgias No headache   Physical Exam Triage Vital Signs ED Triage Vitals  Encounter Vitals Group     BP 08/27/23 1323 (!) 138/91     Systolic BP Percentile --      Diastolic BP Percentile --      Pulse Rate 08/27/23 1323 (!) 113     Resp 08/27/23 1323 18     Temp  08/27/23 1323 98.7 F (37.1 C)     Temp src --      SpO2 08/27/23 1323 95 %     Weight --      Height --      Head Circumference --      Peak Flow --      Pain Score 08/27/23 1326 8     Pain Loc --      Pain Education --      Exclude from Growth Chart --    No data found.  Updated Vital Signs BP (!) 138/91   Pulse (!) 113   Temp 98.7 F (37.1 C)   Resp 18   SpO2 95%   Visual Acuity Right Eye Distance:   Left Eye Distance:   Bilateral Distance:    Right Eye Near:   Left Eye Near:    Bilateral Near:     Physical Exam Nursing notes and Vital Signs reviewed. Appearance:  Patient appears stated age, and in no acute distress Eyes:  Pupils are equal, round, and reactive to light and accomodation.  Extraocular movement is intact.  Conjunctivae are not inflamed  Ears:  Canals normal.  Tympanic membranes normal.  Nose:  Normal turbinates.  No sinus tenderness.  Pharynx:  Mildly  erythematous Neck:  Supple.  Slightly tender tonsillar nodes, not enlarged.  Lungs:  Clear to auscultation.  Breath sounds are equal.  Moving air well. Heart:  Regular rate and rhythm without murmurs, rubs, or gallops.  Abdomen:  Nontender without masses or hepatosplenomegaly.  Bowel sounds are present.  No CVA or flank tenderness.  Extremities:  No edema.  Skin:  No rash present.   UC Treatments / Results  Labs (all labs ordered are listed, but only abnormal results are displayed) Labs Reviewed  POCT RAPID STREP A (OFFICE) - Abnormal; Notable for the following components:      Result Value   Rapid Strep A Screen Positive (*)    All other components within normal limits    EKG   Radiology No results found.  Procedures Procedures (including critical care time)  Medications Ordered in UC Medications - No data to display  Initial Impression / Assessment and Plan / UC Course  I have reviewed the triage vital signs and the nursing notes.  Pertinent labs & imaging results that were  available during my care of the patient were reviewed by me and considered in my medical decision making (see chart for details).    Begin Penicillin  VK 500mg  Q12hr for 10 days. Followup with Family Doctor if not improved in 10 days.  Final Clinical Impressions(s) / UC Diagnoses   Final diagnoses:  Strep pharyngitis     Discharge Instructions      Try warm salt water gargles for sore throat.  May take Ibuprofen 200mg , 4 tabs every 8 hours with food for sore throat.  ED Prescriptions     Medication Sig Dispense Auth. Provider   penicillin  v potassium (VEETID) 500 MG tablet Take one tab by mouth Q12 hours for 10 days 20 tablet Leon Rajas, MD         Leon Rajas, MD 08/29/23 [phone]
